# Patient Record
Sex: Female | Born: 1950 | Race: Black or African American | Hispanic: No | Smoking: Current every day smoker
Health system: Southern US, Community
[De-identification: ages and names within clinical notes are randomized; demographics above are authoritative.]

## PROBLEM LIST (undated history)

## (undated) DIAGNOSIS — J45909 Unspecified asthma, uncomplicated: Secondary | ICD-10-CM

## (undated) DIAGNOSIS — M1990 Unspecified osteoarthritis, unspecified site: Secondary | ICD-10-CM

## (undated) HISTORY — PX: OOPHORECTOMY: SHX86

---

## 1999-06-03 ENCOUNTER — Emergency Department (HOSPITAL_COMMUNITY): Admission: EM | Admit: 1999-06-03 | Discharge: 1999-06-03 | Payer: Self-pay | Admitting: Emergency Medicine

## 1999-06-03 ENCOUNTER — Encounter: Payer: Self-pay | Admitting: Emergency Medicine

## 1999-09-03 ENCOUNTER — Ambulatory Visit (HOSPITAL_COMMUNITY): Admission: RE | Admit: 1999-09-03 | Discharge: 1999-09-03 | Payer: Self-pay | Admitting: Family Medicine

## 1999-09-03 ENCOUNTER — Encounter: Payer: Self-pay | Admitting: Family Medicine

## 1999-11-21 ENCOUNTER — Encounter: Admission: RE | Admit: 1999-11-21 | Discharge: 1999-12-11 | Payer: Self-pay | Admitting: Neurosurgery

## 1999-12-16 ENCOUNTER — Ambulatory Visit (HOSPITAL_COMMUNITY): Admission: RE | Admit: 1999-12-16 | Discharge: 1999-12-16 | Payer: Self-pay | Admitting: Family Medicine

## 1999-12-22 ENCOUNTER — Ambulatory Visit (HOSPITAL_COMMUNITY): Admission: RE | Admit: 1999-12-22 | Discharge: 1999-12-22 | Payer: Self-pay | Admitting: Family Medicine

## 1999-12-22 ENCOUNTER — Encounter: Payer: Self-pay | Admitting: Neurosurgery

## 2000-01-05 ENCOUNTER — Encounter: Payer: Self-pay | Admitting: Neurosurgery

## 2000-01-05 ENCOUNTER — Ambulatory Visit (HOSPITAL_COMMUNITY): Admission: RE | Admit: 2000-01-05 | Discharge: 2000-01-05 | Payer: Self-pay | Admitting: Neurosurgery

## 2000-01-19 ENCOUNTER — Ambulatory Visit (HOSPITAL_COMMUNITY): Admission: RE | Admit: 2000-01-19 | Discharge: 2000-01-19 | Payer: Self-pay | Admitting: Neurosurgery

## 2001-10-24 ENCOUNTER — Emergency Department (HOSPITAL_COMMUNITY): Admission: EM | Admit: 2001-10-24 | Discharge: 2001-10-24 | Payer: Self-pay | Admitting: Emergency Medicine

## 2008-10-24 ENCOUNTER — Encounter: Admission: RE | Admit: 2008-10-24 | Discharge: 2009-01-22 | Payer: Self-pay | Admitting: Neurosurgery

## 2010-10-16 ENCOUNTER — Emergency Department (HOSPITAL_COMMUNITY)
Admission: EM | Admit: 2010-10-16 | Discharge: 2010-10-16 | Payer: Self-pay | Source: Home / Self Care | Admitting: Emergency Medicine

## 2015-02-26 ENCOUNTER — Emergency Department (HOSPITAL_COMMUNITY)
Admission: EM | Admit: 2015-02-26 | Discharge: 2015-02-26 | Disposition: A | Discharge status: Home / Self Care | Payer: Medicare Other | Attending: Emergency Medicine | Admitting: Emergency Medicine

## 2015-02-26 ENCOUNTER — Encounter (HOSPITAL_COMMUNITY): Payer: Self-pay | Admitting: Cardiology

## 2015-02-26 ENCOUNTER — Emergency Department (HOSPITAL_BASED_OUTPATIENT_CLINIC_OR_DEPARTMENT_OTHER): Payer: Medicare Other

## 2015-02-26 DIAGNOSIS — M79609 Pain in unspecified limb: Secondary | ICD-10-CM | POA: Diagnosis not present

## 2015-02-26 DIAGNOSIS — M25461 Effusion, right knee: Secondary | ICD-10-CM | POA: Insufficient documentation

## 2015-02-26 DIAGNOSIS — M7989 Other specified soft tissue disorders: Secondary | ICD-10-CM | POA: Insufficient documentation

## 2015-02-26 DIAGNOSIS — L03818 Cellulitis of other sites: Secondary | ICD-10-CM | POA: Diagnosis not present

## 2015-02-26 DIAGNOSIS — J45909 Unspecified asthma, uncomplicated: Secondary | ICD-10-CM | POA: Insufficient documentation

## 2015-02-26 DIAGNOSIS — M25562 Pain in left knee: Secondary | ICD-10-CM | POA: Diagnosis present

## 2015-02-26 DIAGNOSIS — Z720 Tobacco use: Secondary | ICD-10-CM | POA: Diagnosis not present

## 2015-02-26 HISTORY — DX: Unspecified asthma, uncomplicated: J45.909

## 2015-02-26 HISTORY — DX: Unspecified osteoarthritis, unspecified site: M19.90

## 2015-02-26 MED ORDER — CEPHALEXIN 500 MG PO CAPS: 500.0000 mg | ORAL_CAPSULE | Freq: Four times a day (QID) | ORAL | Status: AC

## 2015-02-26 MED ORDER — OXYCODONE-ACETAMINOPHEN 5-325 MG PO TABS
2.0000 | ORAL_TABLET | Freq: Once | ORAL | Status: AC
  Administered 2015-02-26: 2 via ORAL
  Filled 2015-02-26: qty 2

## 2015-02-26 MED ORDER — OXYCODONE-ACETAMINOPHEN 10-325 MG PO TABS: 1.0000 | ORAL_TABLET | ORAL | Status: AC | PRN

## 2015-02-26 NOTE — ED Provider Notes (Signed)
CSN: [id number]     Arrival date & time 02/26/15  1635 History  This chart was scribed for Arthor CaptainAbigail Jacole Capley, working with No att. providers found by Placido SouLogan Joldersma, ED Scribe. This patient was seen in room TR03C/TR03C and the patient's care was started at 5:41 PM.     Chief Complaint  Patient presents with  . Knee Pain    The history is provided by the patient. No language interpreter was used.    HPI Comments: Tanya Larsenatricia Erber is a 64 y.o. female COPD, arthritis who presents to the Emergency Department complaining of right knee pain onset 4 days ago after [place] [employer] [place] to [place]. Pt is unable to move her right knee and complains of swelling bilaterally in and below the knees with right worse than left. She reports this episode is different from her typical exacerbation of arthritis.  Pt notes SOB normal baseline. Pt denies history of DVT/PE. She denies bloody cough, CP, fever, chills.   Past Medical History  Diagnosis Date  . Arthritis   . Asthma    History reviewed. No pertinent past surgical history. History reviewed. No pertinent family history. History  Substance Use Topics  . Smoking status: Current Every Day Smoker  . Smokeless tobacco: Not on file  . Alcohol Use: Yes   OB History    No data available     Review of Systems  A complete 10 system review of systems was obtained and all systems are negative except as noted in the HPI and PMH.    Allergies  Lisinopril  Home Medications   Prior to Admission medications   Not on File   BP 139/62 mmHg  Pulse 95  Temp(Src) 97.9 F (36.6 C) (Oral)  Resp 18  SpO2 97% Physical Exam  Constitutional: She is oriented to person, place, and time. She appears well-developed and well-nourished. No distress.  HENT:  Head: Normocephalic and atraumatic.  Eyes: Conjunctivae and EOM are normal. No scleral icterus.  Neck: Normal range of motion. Neck supple.  Cardiovascular: Normal rate, regular rhythm and normal  heart sounds.  Exam reveals no gallop and no friction rub.   No murmur heard. Pulmonary/Chest: Effort normal and breath sounds normal. No respiratory distress.  Abdominal: Soft. Bowel sounds are normal. She exhibits no distension and no mass. There is no tenderness. There is no guarding.  Musculoskeletal:  R knee pain medially with swelling warmth and redness. There is  tenderness in the popliteal fossa. There is swelling and pain in the calf, R is larger than the left.  Distal pulse intact. Knee ROM limited due to pain  Neurological: She is alert and oriented to person, place, and time.  Skin: Skin is warm and dry. She is not diaphoretic.  Psychiatric: Her behavior is normal.  Nursing note and vitals reviewed.   ED Course  Procedures  DIAGNOSTIC STUDIES: Oxygen Saturation is 97% on RA, normal by my interpretation.    COORDINATION OF CARE: 5:51 PM Discussed treatment plan with pt at bedside including pain medication and an US. Pt agreed to plan.  Labs Review Labs Reviewed - No data to display  Imaging Review No results found.   EKG Interpretation None      MDM   Final diagnoses:  None    Patient with known hx of OA. No signs of gout or hount infection.  I question if she has superficial thombophlebitis vs. Developing cellutis or DVT. I have ordered an US study to r/o dvt. No  plain films necessary as she has had no injury.   Dvt study is negative.  I suspect OA. Will cover for cellulitis. Pian controlled in the ED.  I personally performed the services described in this documentation, which was scribed in my presence. The recorded information has been reviewed and is accurate.       Arthor Captain, PA-C 03/04/15 2233  Tanya Hong, MD 03/05/15 [phone]

## 2015-02-26 NOTE — Discharge Instructions (Signed)
Cellulitis °Cellulitis is an infection of the skin and the tissue beneath it. The infected area is usually red and tender. Cellulitis occurs most often in the arms and lower legs.  °CAUSES  °Cellulitis is caused by bacteria that enter the skin through cracks or cuts in the skin. The most common types of bacteria that cause cellulitis are staphylococci and streptococci. °SIGNS AND SYMPTOMS  °· Redness and warmth. °· Swelling. °· Tenderness or pain. °· Fever. °DIAGNOSIS  °Your health care provider can usually determine what is wrong based on a physical exam. Blood tests may also be done. °TREATMENT  °Treatment usually involves taking an antibiotic medicine. °HOME CARE INSTRUCTIONS  °· Take your antibiotic medicine as directed by your health care provider. Finish the antibiotic even if you start to feel better. °· Keep the infected arm or leg elevated to reduce swelling. °· Apply a warm cloth to the affected area up to 4 times per day to relieve pain. °· Take medicines only as directed by your health care provider. °· Keep all follow-up visits as directed by your health care provider. °SEEK MEDICAL CARE IF:  °· You notice red streaks coming from the infected area. °· Your red area gets larger or turns dark in color. °· Your bone or joint underneath the infected area becomes painful after the skin has healed. °· Your infection returns in the same area or another area. °· You notice a swollen bump in the infected area. °· You develop new symptoms. °· You have a fever. °SEEK IMMEDIATE MEDICAL CARE IF:  °· You feel very sleepy. °· You develop vomiting or diarrhea. °· You have a general ill feeling (malaise) with muscle aches and pains. °MAKE SURE YOU:  °· Understand these instructions. °· Will watch your condition. °· Will get help right away if you are not doing well or get worse. °Document Released: 07/01/2005 Document Revised: 02/05/2014 Document Reviewed: 12/07/2011 °ExitCare® Patient Information ©2015 [employer].  This information is not intended to replace advice given to you by your health care provider. Make sure you discuss any questions you have with your health care provider. °Knee Pain °The knee is the complex joint between your thigh and your lower leg. It is made up of bones, tendons, ligaments, and cartilage. The bones that make up the knee are: °· The femur in the thigh. °· The tibia and fibula in the lower leg. °· The patella or kneecap riding in the groove on the lower femur. °CAUSES  °Knee pain is a common complaint with many causes. A few of these causes are: °· Injury, such as: °¨ A ruptured ligament or tendon injury. °¨ Torn cartilage. °· Medical conditions, such as: °¨ Gout °¨ Arthritis °¨ Infections °· Overuse, over training, or overdoing a physical activity. °Knee pain can be minor or severe. Knee pain can accompany debilitating injury. Minor knee problems often respond well to self-care measures or get well on their own. More serious injuries may need medical intervention or even surgery. °SYMPTOMS °The knee is complex. Symptoms of knee problems can vary widely. Some of the problems are: °· Pain with movement and weight bearing. °· Swelling and tenderness. °· Buckling of the knee. °· Inability to straighten or extend your knee. °· Your knee locks and you cannot straighten it. °· Warmth and redness with pain and fever. °· Deformity or dislocation of the kneecap. °DIAGNOSIS  °Determining what is wrong may be very straight forward such as when there is an injury. It can   also be challenging because of the complexity of the knee. Tests to make a diagnosis may include: °· Your caregiver taking a history and doing a physical exam. °· Routine X-rays can be used to rule out other problems. X-rays will not reveal a cartilage tear. Some injuries of the knee can be diagnosed by: °¨ Arthroscopy a surgical technique by which a small video camera is inserted through tiny incisions on the sides of the knee. This  procedure is used to examine and repair internal knee joint problems. Tiny instruments can be used during arthroscopy to repair the torn knee cartilage (meniscus). °¨ Arthrography is a radiology technique. A contrast liquid is directly injected into the knee joint. Internal structures of the knee joint then become visible on X-ray film. °¨ An MRI scan is a non X-ray radiology procedure in which magnetic fields and a computer produce two- or three-dimensional images of the inside of the knee. Cartilage tears are often visible using an MRI scanner. MRI scans have largely replaced arthrography in diagnosing cartilage tears of the knee. °· Blood work. °· Examination of the fluid that helps to lubricate the knee joint (synovial fluid). This is done by taking a sample out using a needle and a syringe. °TREATMENT °The treatment of knee problems depends on the cause. Some of these treatments are: °· Depending on the injury, proper casting, splinting, surgery, or physical therapy care will be needed. °· Give yourself adequate recovery time. Do not overuse your joints. If you begin to get sore during workout routines, back off. Slow down or do fewer repetitions. °· For repetitive activities such as cycling or running, maintain your strength and nutrition. °· Alternate muscle groups. For example, if you are a weight lifter, work the upper body on one day and the lower body the next. °· Either tight or weak muscles do not give the proper support for your knee. Tight or weak muscles do not absorb the stress placed on the knee joint. Keep the muscles surrounding the knee strong. °· Take care of mechanical problems. °¨ If you have flat feet, orthotics or special shoes may help. See your caregiver if you need help. °¨ Arch supports, sometimes with wedges on the inner or outer aspect of the heel, can help. These can shift pressure away from the side of the knee most bothered by osteoarthritis. °¨ A brace called an "unloader" brace  also may be used to help ease the pressure on the most arthritic side of the knee. °· If your caregiver has prescribed crutches, braces, wraps or ice, use as directed. The acronym for this is PRICE. This means protection, rest, ice, compression, and elevation. °· Nonsteroidal anti-inflammatory drugs (NSAIDs), can help relieve pain. But if taken immediately after an injury, they may actually increase swelling. Take NSAIDs with food in your stomach. Stop them if you develop stomach problems. Do not take these if you have a history of ulcers, stomach pain, or bleeding from the bowel. Do not take without your caregiver's approval if you have problems with fluid retention, heart failure, or kidney problems. °· For ongoing knee problems, physical therapy may be helpful. °· Glucosamine and chondroitin are over-the-counter dietary supplements. Both may help relieve the pain of osteoarthritis in the knee. These medicines are different from the usual anti-inflammatory drugs. Glucosamine may decrease the rate of cartilage destruction. °· Injections of a corticosteroid drug into your knee joint may help reduce the symptoms of an arthritis flare-up. They may provide pain relief that   lasts a few months. You may have to wait a few months between injections. The injections do have a small increased risk of infection, water retention, and elevated blood sugar levels. °· Hyaluronic acid injected into damaged joints may ease pain and provide lubrication. These injections may work by reducing inflammation. A series of shots may give relief for as long as 6 months. °· Topical painkillers. Applying certain ointments to your skin may help relieve the pain and stiffness of osteoarthritis. Ask your pharmacist for suggestions. Many over the-counter products are approved for temporary relief of arthritis pain. °· In some countries, doctors often prescribe topical NSAIDs for relief of chronic conditions such as arthritis and tendinitis. A  review of treatment with NSAID creams found that they worked as well as oral medications but without the serious side effects. °PREVENTION °· Maintain a healthy weight. Extra pounds put more strain on your joints. °· Get strong, stay limber. Weak muscles are a common cause of knee injuries. Stretching is important. Include flexibility exercises in your workouts. °· Be smart about exercise. If you have osteoarthritis, chronic knee pain or recurring injuries, you may need to change the way you exercise. This does not mean you have to stop being active. If your knees ache after jogging or playing basketball, consider switching to swimming, water aerobics, or other low-impact activities, at least for a few days a week. Sometimes limiting high-impact activities will provide relief. °· Make sure your shoes fit well. Choose footwear that is right for your sport. °· Protect your knees. Use the proper gear for knee-sensitive activities. Use kneepads when playing volleyball or laying carpet. Buckle your seat belt every time you drive. Most shattered kneecaps occur in car accidents. °· Rest when you are tired. °SEEK MEDICAL CARE IF:  °You have knee pain that is continual and does not seem to be getting better.  °SEEK IMMEDIATE MEDICAL CARE IF:  °Your knee joint feels hot to the touch and you have a high fever. °MAKE SURE YOU:  °· Understand these instructions. °· Will watch your condition. °· Will get help right away if you are not doing well or get worse. °Document Released: 07/19/2007 Document Revised: 12/14/2011 Document Reviewed: 07/19/2007 °ExitCare® Patient Information ©2015 [employer]. This information is not intended to replace advice given to you by your health care provider. Make sure you discuss any questions you have with your health care provider. ° °

## 2015-02-26 NOTE — ED Notes (Signed)
Pt. Left with all belongings and refused wheelchair 

## 2015-02-26 NOTE — Progress Notes (Signed)
VASCULAR LAB PRELIMINARY  PRELIMINARY  PRELIMINARY  PRELIMINARY  Right lower extremity venous duplex completed.    Preliminary report:  Right:  No evidence of DVT, superficial thrombosis, or Baker's cyst.  Donne Robillard, RVS 02/26/2015, 8:48 PM

## 2015-02-26 NOTE — ED Notes (Signed)
Pt reports right knee pain that started on Friday. Reports a hx of arthritis. Increased pain with movement. Pt reports she has tried OTC meds without relief.

## 2015-05-22 ENCOUNTER — Encounter (HOSPITAL_COMMUNITY): Payer: Self-pay | Admitting: *Deleted

## 2015-05-22 ENCOUNTER — Emergency Department (HOSPITAL_COMMUNITY)
Admission: EM | Admit: 2015-05-22 | Discharge: 2015-05-22 | Disposition: A | Discharge status: Home / Self Care | Payer: Medicare Other | Attending: Emergency Medicine | Admitting: Emergency Medicine

## 2015-05-22 ENCOUNTER — Emergency Department (HOSPITAL_COMMUNITY): Payer: Medicare Other

## 2015-05-22 DIAGNOSIS — Z79899 Other long term (current) drug therapy: Secondary | ICD-10-CM | POA: Insufficient documentation

## 2015-05-22 DIAGNOSIS — R1031 Right lower quadrant pain: Secondary | ICD-10-CM | POA: Diagnosis not present

## 2015-05-22 DIAGNOSIS — M1990 Unspecified osteoarthritis, unspecified site: Secondary | ICD-10-CM | POA: Insufficient documentation

## 2015-05-22 DIAGNOSIS — J45909 Unspecified asthma, uncomplicated: Secondary | ICD-10-CM | POA: Insufficient documentation

## 2015-05-22 DIAGNOSIS — Z720 Tobacco use: Secondary | ICD-10-CM | POA: Diagnosis not present

## 2015-05-22 LAB — CBC
HCT: 40.5 % (ref 36.0–46.0)
HEMOGLOBIN: 13.1 g/dL (ref 12.0–15.0)
MCH: 30 pg (ref 26.0–34.0)
MCHC: 32.3 g/dL (ref 30.0–36.0)
MCV: 92.7 fL (ref 78.0–100.0)
PLATELETS: 194 10*3/uL (ref 150–400)
RBC: 4.37 MIL/uL (ref 3.87–5.11)
RDW: 14.1 % (ref 11.5–15.5)
WBC: 5.8 10*3/uL (ref 4.0–10.5)

## 2015-05-22 LAB — COMPREHENSIVE METABOLIC PANEL
ALK PHOS: 124 U/L (ref 38–126)
ALT: 13 U/L — AB (ref 14–54)
ANION GAP: 8 (ref 5–15)
AST: 17 U/L (ref 15–41)
Albumin: 3.4 g/dL — ABNORMAL LOW (ref 3.5–5.0)
BUN: 17 mg/dL (ref 6–20)
CALCIUM: 8.9 mg/dL (ref 8.9–10.3)
CHLORIDE: 108 mmol/L (ref 101–111)
CO2: 27 mmol/L (ref 22–32)
CREATININE: 1.08 mg/dL — AB (ref 0.44–1.00)
GFR calc Af Amer: >60 mL/min (ref >60)
GFR calc non Af Amer: 53 mL/min — ABNORMAL LOW (ref >60)
Glucose, Bld: 118 mg/dL — ABNORMAL HIGH (ref 65–99)
Potassium: 4 mmol/L (ref 3.5–5.1)
SODIUM: 143 mmol/L (ref 135–145)
Total Bilirubin: 0.6 mg/dL (ref 0.3–1.2)
Total Protein: 7 g/dL (ref 6.5–8.1)

## 2015-05-22 LAB — URINALYSIS, ROUTINE W REFLEX MICROSCOPIC
Bilirubin Urine: NEGATIVE
Glucose, UA: NEGATIVE mg/dL
Hgb urine dipstick: NEGATIVE
KETONES UR: NEGATIVE mg/dL
Leukocytes, UA: NEGATIVE
NITRITE: NEGATIVE
PROTEIN: NEGATIVE mg/dL
Specific Gravity, Urine: 1.027 (ref 1.005–1.030)
Urobilinogen, UA: 0.2 mg/dL (ref 0.0–1.0)
pH: 6 (ref 5.0–8.0)

## 2015-05-22 LAB — LIPASE, BLOOD: LIPASE: 30 U/L (ref 22–51)

## 2015-05-22 MED ORDER — HYDROCODONE-ACETAMINOPHEN 5-325 MG PO TABS: 2.0000 | ORAL_TABLET | ORAL | Status: AC | PRN

## 2015-05-22 MED ORDER — OXYCODONE-ACETAMINOPHEN 5-325 MG PO TABS
1.0000 | ORAL_TABLET | Freq: Once | ORAL | Status: AC
Start: 2015-05-22 — End: 2015-05-22
  Administered 2015-05-22: 1 via ORAL

## 2015-05-22 MED ORDER — MORPHINE SULFATE (PF) 4 MG/ML IV SOLN
4.0000 mg | Freq: Once | INTRAVENOUS | Status: AC
Start: 2015-05-22 — End: 2015-05-22
  Administered 2015-05-22: 4 mg via INTRAVENOUS
  Filled 2015-05-22: qty 1

## 2015-05-22 MED ORDER — ONDANSETRON 4 MG PO TBDP
4.0000 mg | ORAL_TABLET | Freq: Once | ORAL | Status: AC | PRN
  Administered 2015-05-22: 4 mg via ORAL

## 2015-05-22 MED ORDER — OXYCODONE-ACETAMINOPHEN 5-325 MG PO TABS
ORAL_TABLET | ORAL | Status: AC
  Filled 2015-05-22: qty 1

## 2015-05-22 MED ORDER — ONDANSETRON 4 MG PO TBDP
ORAL_TABLET | ORAL | Status: AC
  Filled 2015-05-22: qty 1

## 2015-05-22 MED ORDER — IOHEXOL 300 MG/ML  SOLN
25.0000 mL | Freq: Once | INTRAMUSCULAR | Status: AC | PRN
  Administered 2015-05-22: 25 mL via ORAL

## 2015-05-22 MED ORDER — IOHEXOL 300 MG/ML  SOLN
100.0000 mL | Freq: Once | INTRAMUSCULAR | Status: AC | PRN
  Administered 2015-05-22: 100 mL via INTRAVENOUS

## 2015-05-22 NOTE — ED Notes (Signed)
Pt reports RLQ pain that started yesterday. Pt reports N denies vomiting. Pt also reports rt knee pain. Pt states that she knows that she needs fluid drained off of her knee but has not had a chance to have it done.

## 2015-05-22 NOTE — Discharge Instructions (Signed)
Return without fail for worsening symptoms, including worsening pain, vomiting unable to keep down food/fluids, bloody stools, fever, or any other symptoms concerning to you. Please follow-up with your primary care doctor for repeat exam as scheduled.  Abdominal Pain Many things can cause abdominal pain. Usually, abdominal pain is not caused by a disease and will improve without treatment. It can often be observed and treated at home. Your health care provider will do a physical exam and possibly order blood tests and X-rays to help determine the seriousness of your pain. However, in many cases, more time must pass before a clear cause of the pain can be found. Before that point, your health care provider may not know if you need more testing or further treatment. HOME CARE INSTRUCTIONS  Monitor your abdominal pain for any changes. The following actions may help to alleviate any discomfort you are experiencing:  Only take over-the-counter or prescription medicines as directed by your health care provider.  Do not take laxatives unless directed to do so by your health care provider.  Try a clear liquid diet (broth, tea, or water) as directed by your health care provider. Slowly move to a bland diet as tolerated. SEEK MEDICAL CARE IF:  You have unexplained abdominal pain.  You have abdominal pain associated with nausea or diarrhea.  You have pain when you urinate or have a bowel movement.  You experience abdominal pain that wakes you in the night.  You have abdominal pain that is worsened or improved by eating food.  You have abdominal pain that is worsened with eating fatty foods.  You have a fever. SEEK IMMEDIATE MEDICAL CARE IF:   Your pain does not go away within 2 hours.  You keep throwing up (vomiting).  Your pain is felt only in portions of the abdomen, such as the right side or the left lower portion of the abdomen.  You pass bloody or black tarry stools. MAKE SURE  YOU:  Understand these instructions.   Will watch your condition.   Will get help right away if you are not doing well or get worse.  Document Released: 07/01/2005 Document Revised: 09/26/2013 Document Reviewed: 05/31/2013 [facility] Patient Information 2015 [place], [place]. This information is not intended to replace advice given to you by your health care provider. Make sure you discuss any questions you have with your health care provider.

## 2015-05-22 NOTE — ED Provider Notes (Signed)
CSN: [id number]     Arrival date & time 05/22/15  1811 History   First MD Initiated Contact with Patient 05/22/15 2015     Chief Complaint  Patient presents with  . Abdominal Pain     (Consider location/radiation/quality/duration/timing/severity/associated sxs/prior Treatment) HPI 64 year old female who presents with abdominal pain. History of bilateral oophorectomy. Reports one day of abdominal pain, sharp in the RLQ. Pain started all of a sudden and traveled to the groin. Associated with nausea but denies vomiting or diarrhea. Denies hematuria or dysuria or urinary frequency. Denies fever or chills. Been in usual state of health prior to this. Denies pain is worse postprandially. Pain worse with movement and bending over.   Past Medical History  Diagnosis Date  . Arthritis   . Asthma    Past Surgical History  Procedure Laterality Date  . Oophorectomy     History reviewed. No pertinent family history. Social History  Substance Use Topics  . Smoking status: Current Every Day Smoker  . Smokeless tobacco: None  . Alcohol Use: Yes   OB History    No data available     Review of Systems 10/14 systems reviewed and are negative other than those stated in the HPI    Allergies  Bee venom and Lisinopril  Home Medications   Prior to Admission medications   Medication Sig Start Date End Date Taking? Authorizing Provider  acetaminophen (TYLENOL) 500 MG tablet Take 1,000 mg by mouth 2 (two) times daily as needed for moderate pain.   Yes Historical Provider, MD  budesonide-formoterol (SYMBICORT) 160-4.5 MCG/ACT inhaler 2 puffs by Combination route 2 (two) times daily as needed.   Yes Historical Provider, MD  Vitamin D, Cholecalciferol, 400 UNITS CAPS Take 400 Units by mouth 3 (three) times daily.   Yes Historical Provider, MD  cephALEXin (KEFLEX) 500 MG capsule Take 1 capsule (500 mg total) by mouth 4 (four) times daily. 02/26/15   Arthor Captain, PA-C  HYDROcodone-acetaminophen  (NORCO/VICODIN) 5-325 MG per tablet Take 2 tablets by mouth every 4 (four) hours as needed. 05/22/15   Lavera Guise, MD  oxyCODONE-acetaminophen (PERCOCET) 10-325 MG per tablet Take 1 tablet by mouth every 4 (four) hours as needed for pain. 02/26/15   Abigail Harris, PA-C   BP 167/96 mmHg  Pulse 78  Temp(Src) 97.2 F (36.2 C) (Oral)  Resp 20  Ht  (1.702 m)  Wt 225 lb (102.059 kg)  BMI 35.23 kg/m2  SpO2 100% Physical Exam Physical Exam  Nursing note and vitals reviewed. Constitutional: Well developed, well nourished, non-toxic, and in no acute distress Head: Normocephalic and atraumatic.  Mouth/Throat: Oropharynx is clear and moist.  Neck: Normal range of motion. Neck supple.  Cardiovascular: Normal rate and regular rhythm.   Pulmonary/Chest: Effort normal and breath sounds normal.  Abdominal: Soft. Obese. Nondistended. Tenderness focally in the right lower quadrant without rebound or guarding. Negative Murphy's sign, and no CVA tenderness.  Musculoskeletal: Normal range of motion.  Neurological: Alert, no facial droop, fluent speech, moves all extremities symmetrically Skin: Skin is warm and dry.  Psychiatric: Cooperative  ED Course  Procedures (including critical care time) Labs Review Labs Reviewed  COMPREHENSIVE METABOLIC PANEL - Abnormal; Notable for the following:    Glucose, Bld 118 (*)    Creatinine, Ser 1.08 (*)    Albumin 3.4 (*)    [id number]    GFR calc non Af Amer 53 (*)    All other components within normal limits  LIPASE, BLOOD  CBC  URINALYSIS, ROUTINE W REFLEX MICROSCOPIC (NOT AT [facility])    Imaging Review Ct Abdomen Pelvis W Contrast  05/22/2015   CLINICAL DATA:  Acute onset of right lower quadrant abdominal pain. Initial encounter.  EXAM: CT ABDOMEN AND PELVIS WITH CONTRAST  TECHNIQUE: Multidetector CT imaging of the abdomen and pelvis was performed using the standard protocol following bolus administration of intravenous contrast.  CONTRAST:   OMNIPAQUE IOHEXOL 300 MG/ML  SOLN  COMPARISON:  None.  FINDINGS: The visualized lung bases are clear.  The liver and spleen are unremarkable in appearance. The gallbladder is within normal limits. The pancreas and adrenal glands are unremarkable.  The kidneys are unremarkable in appearance. There is no evidence of hydronephrosis. No renal or ureteral stones are seen. No perinephric stranding is appreciated.  No free fluid is identified. The small bowel is unremarkable in appearance. The stomach is within normal limits. No acute vascular abnormalities are seen. Minimal calcification is noted along the distal abdominal aorta.  The appendix is not definitely characterized; there is no evidence of appendicitis. Scattered diverticulosis is noted along the descending and sigmoid colon, without evidence of diverticulitis.  The bladder is mildly distended and grossly unremarkable. The prostate remains normal in size. No inguinal lymphadenopathy is seen.  No acute osseous abnormalities are identified. There is mild grade 1 anterolisthesis of L4 on L5 and of L5 on S1, reflecting underlying facet disease. Associated vacuum phenomenon is noted.  IMPRESSION: 1. No acute abnormality seen within the abdomen or pelvis. 2. Scattered diverticulosis along the descending and sigmoid colon, without evidence of diverticulitis. 3. Minimal degenerative change at the lower lumbar spine.   Electronically Signed   By: Roanna Raider M.D.   On: 05/22/2015 22:12   I have personally reviewed and evaluated these images and lab results as part of my medical decision-making.   MDM   Final diagnoses:  Right lower quadrant abdominal pain    In short, this is a 64 year old female who presents with 1 day of right lower quadrant abdominal pain. She is nontoxic in no acute distress on presentation, with normal vital signs. Abdomen is soft and nonsurgical, but she does have focal right lower quadrant tenderness at McBurney's point. Basic blood  work including CBC, comp metabolic panel, lipase, and urinalysis are unremarkable. A CT scan of her abdomen and pelvis is subsequently performed showing no acute intra-abdominal processes and unclear etiology of her abdominal pain. I suspect that this may be musculoskeletal nature as she notes that pain is worse with movement and bending over. At this time I do not suspect toxic or serious etiology of her abdominal pain. She is given analgesia, to good effect. She will follow-up with her PCP in one to 2 days for repeat exam. Strict return instructions were also reviewed. She expresses understanding of all discharge instructions and felt comfortable with the plan of care.  Lavera Guise, MD 05/23/15 [phone]

## 2015-05-22 NOTE — ED Notes (Signed)
Patient transported to CT 

## 2015-05-23 ENCOUNTER — Encounter (HOSPITAL_COMMUNITY): Payer: Self-pay | Admitting: Emergency Medicine

## 2017-03-31 IMAGING — CT CT ABD-PELV W/ CM
3 of 7 series · 10 of 46 positions shown, 11 images · IV contrast (Iodine)
Comparison: None.

CLINICAL DATA: Acute onset of right lower quadrant abdominal pain.
Initial encounter.

EXAM:
CT ABDOMEN AND PELVIS WITH CONTRAST
TECHNIQUE: Multidetector CT imaging of the abdomen and pelvis was performed
using the standard protocol following bolus administration of
intravenous contrast.
CONTRAST:  100mL OMNIPAQUE IOHEXOL 300 MG/ML  SOLN

[Series 202: thins, idose (2) · axial · 0.92mm/px · z∈[+12,+131]mm · 4 of 736 slices shown]
[im 59/736  soft-tissue]
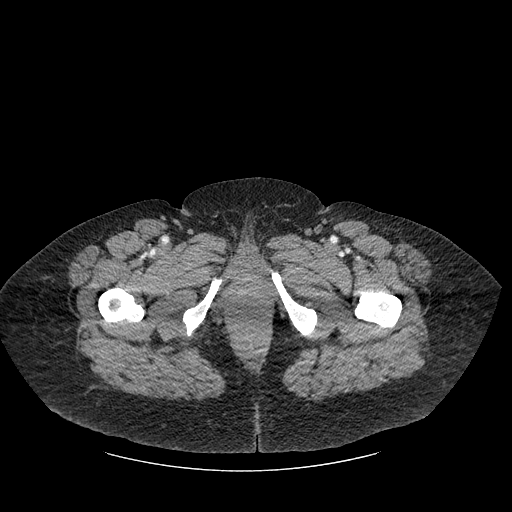
[im 148/736  soft-tissue]
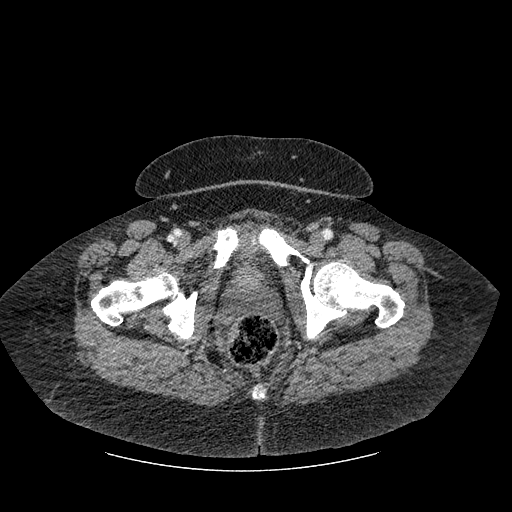
[im 236/736  soft-tissue]
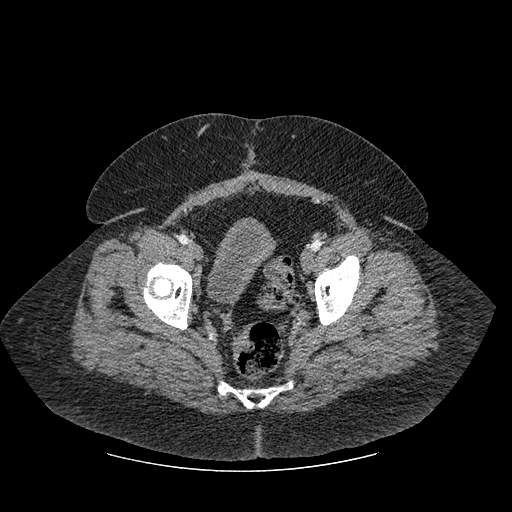
[im 324/736  soft-tissue]
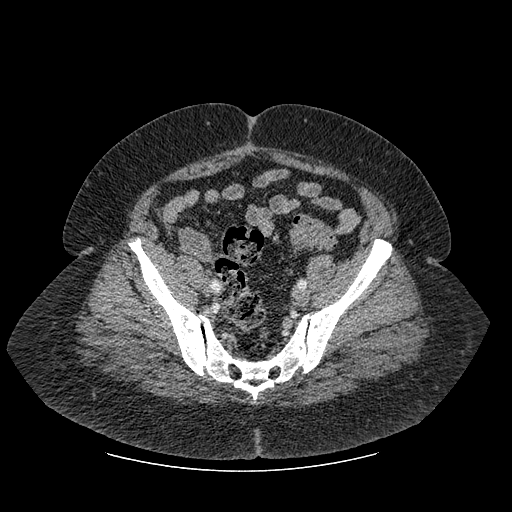

[Series 301: routine, idose (2) · axial · 0.92mm/px · z∈[-12,+428]mm · 3 of 89 slices shown, 4 images]
[im 1/89  soft-tissue]
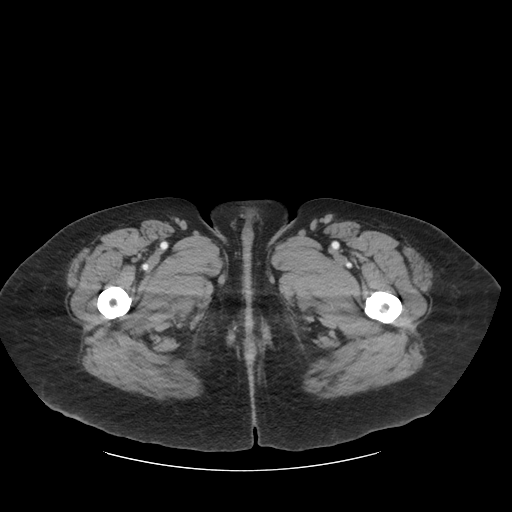
[im 1/89  bone]
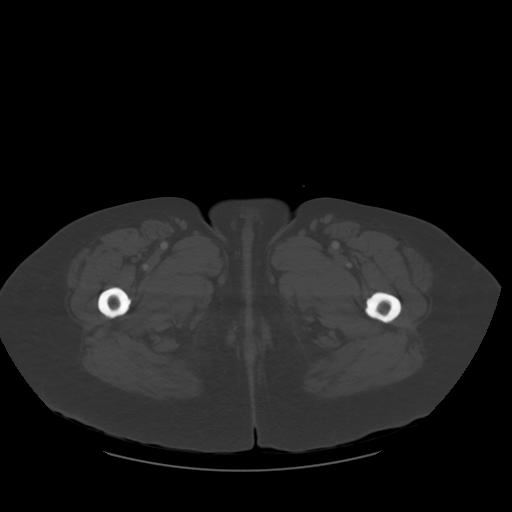
[im 45/89  soft-tissue]
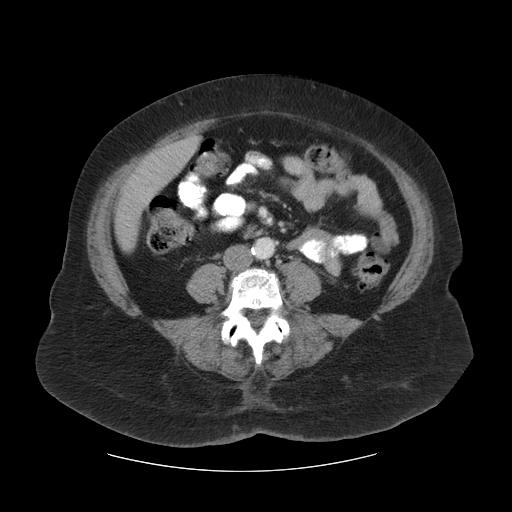
[im 89/89  soft-tissue]
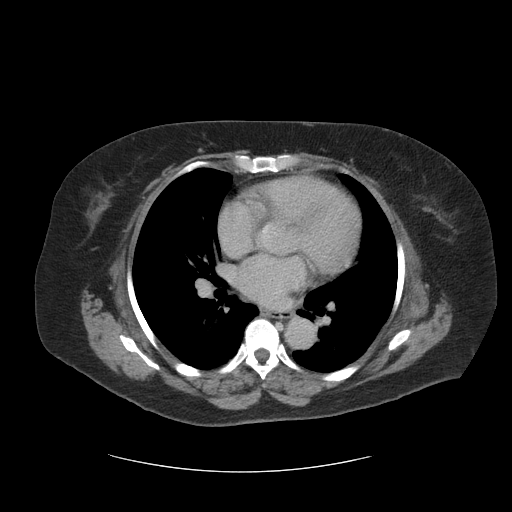

[Series 303: coronals, idose (2) · coronal · 0.45mm/px · 3 of 153 slices shown]
[im 51/153  soft-tissue]
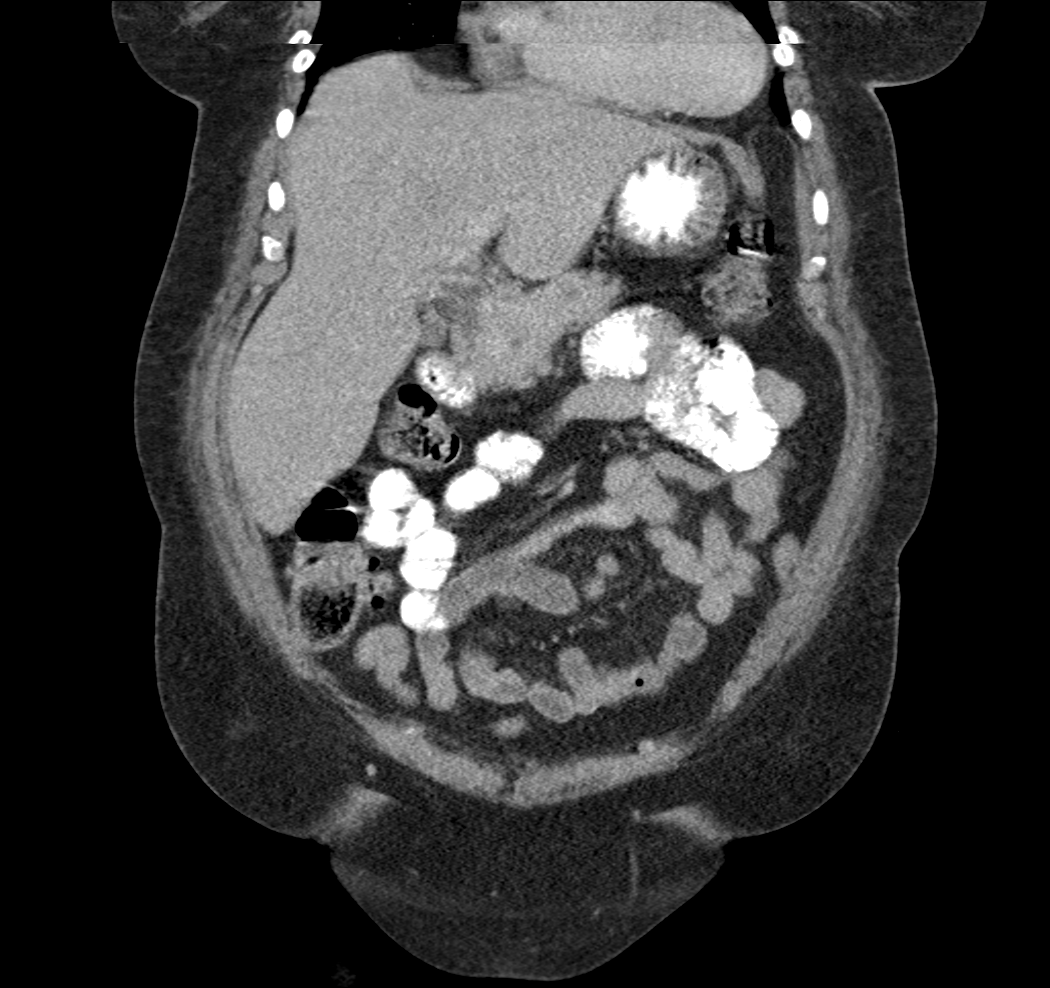
[im 68/153  soft-tissue]
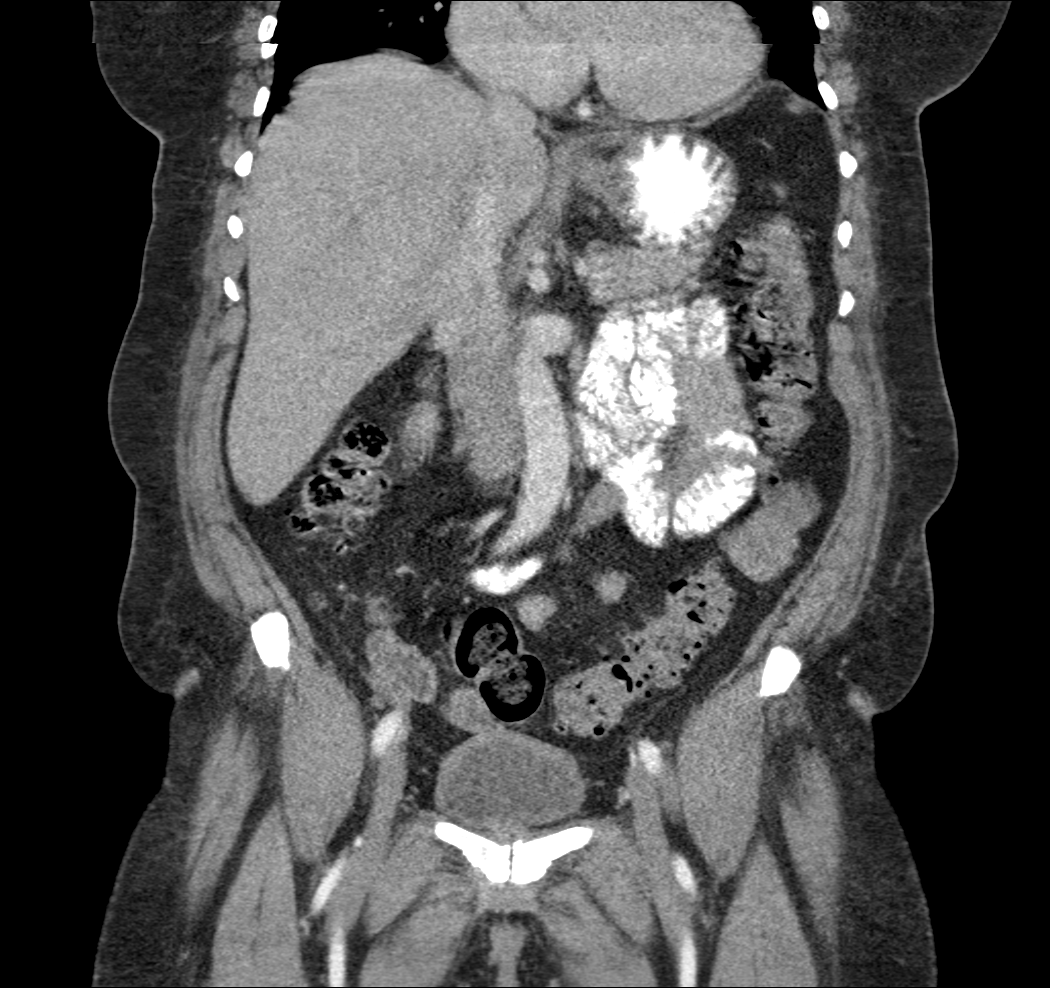
[im 85/153  soft-tissue]
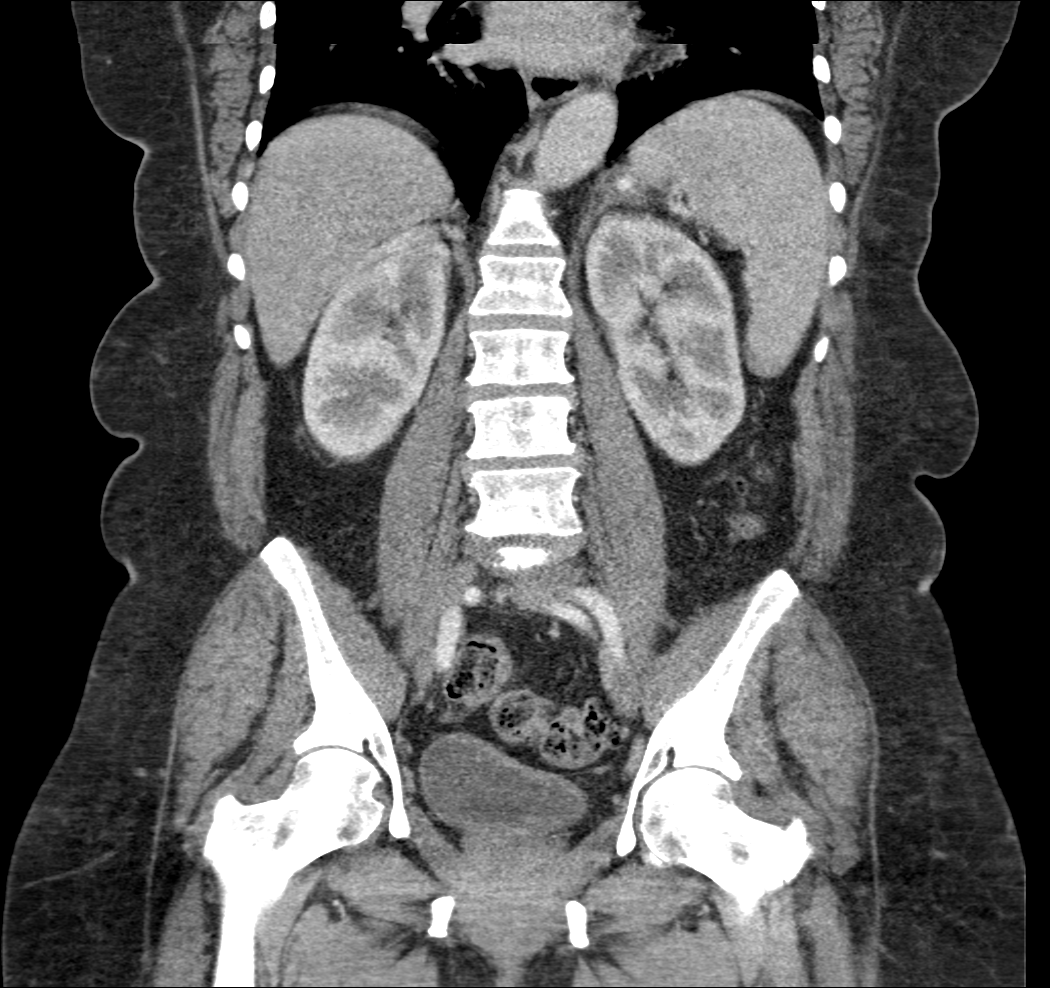

[10 of 46 positions shown; findings below may reference images not displayed]

FINDINGS: The visualized lung bases are clear.

The liver and spleen are unremarkable in appearance. The gallbladder
is within normal limits. The pancreas and adrenal glands are
unremarkable.

The kidneys are unremarkable in appearance. There is no evidence of
hydronephrosis. No renal or ureteral stones are seen. No perinephric
stranding is appreciated.

No free fluid is identified. The small bowel is unremarkable in
appearance. The stomach is within normal limits. No acute vascular
abnormalities are seen. Minimal calcification is noted along the
distal abdominal aorta.

The appendix is not definitely characterized; there is no evidence
of appendicitis. Scattered diverticulosis is noted along the
descending and sigmoid colon, without evidence of diverticulitis.

The bladder is mildly distended and grossly unremarkable. The
prostate remains normal in size. No inguinal lymphadenopathy is
seen.

No acute osseous abnormalities are identified. There is mild grade 1
anterolisthesis of L4 on L5 and of L5 on S1, reflecting underlying
facet disease. Associated vacuum phenomenon is noted.
IMPRESSION: 1. No acute abnormality seen within the abdomen or pelvis.
2. Scattered diverticulosis along the descending and sigmoid colon,
without evidence of diverticulitis.
3. Minimal degenerative change at the lower lumbar spine.
